# Patient Record
Sex: Male | Born: 1960 | Race: White | Hispanic: No | Marital: Married | Smoking: Never smoker
Health system: Southern US, Community
[De-identification: ages and names within clinical notes are randomized; demographics above are authoritative.]

## PROBLEM LIST (undated history)

## (undated) DIAGNOSIS — J3489 Other specified disorders of nose and nasal sinuses: Secondary | ICD-10-CM

## (undated) DIAGNOSIS — R42 Dizziness and giddiness: Secondary | ICD-10-CM

## (undated) DIAGNOSIS — M5126 Other intervertebral disc displacement, lumbar region: Secondary | ICD-10-CM

## (undated) DIAGNOSIS — Z973 Presence of spectacles and contact lenses: Secondary | ICD-10-CM

## (undated) DIAGNOSIS — M5136 Other intervertebral disc degeneration, lumbar region: Secondary | ICD-10-CM

---

## 2008-01-10 ENCOUNTER — Ambulatory Visit: Payer: Self-pay | Admitting: Family Medicine

## 2008-01-13 ENCOUNTER — Ambulatory Visit: Payer: Self-pay | Admitting: Family Medicine

## 2010-08-10 ENCOUNTER — Ambulatory Visit: Payer: Self-pay | Admitting: Gastroenterology

## 2011-11-26 ENCOUNTER — Ambulatory Visit: Payer: Self-pay | Admitting: Family Medicine

## 2014-10-19 ENCOUNTER — Ambulatory Visit: Payer: Self-pay | Admitting: Family Medicine

## 2015-05-23 ENCOUNTER — Other Ambulatory Visit: Payer: Self-pay | Admitting: Ophthalmology

## 2015-05-23 ENCOUNTER — Ambulatory Visit
Admission: RE | Admit: 2015-05-23 | Discharge: 2015-05-23 | Disposition: A | Discharge status: Home / Self Care | Payer: 59 | Source: Ambulatory Visit | Attending: Ophthalmology | Admitting: Ophthalmology

## 2015-05-23 DIAGNOSIS — D4989 Neoplasm of unspecified behavior of other specified sites: Secondary | ICD-10-CM

## 2015-05-23 DIAGNOSIS — D487 Neoplasm of uncertain behavior of other specified sites: Secondary | ICD-10-CM | POA: Diagnosis not present

## 2015-05-23 MED ORDER — GADOBENATE DIMEGLUMINE 529 MG/ML IV SOLN
20.0000 mL | Freq: Once | INTRAVENOUS | Status: AC | PRN
  Administered 2015-05-23: 16 mL via INTRAVENOUS

## 2015-05-29 HISTORY — PX: RECONSTRUCTION MANDIBLE / MAXILLA: SUR1083

## 2016-07-09 IMAGING — MR MR ORBITS WO/W CM
4 of 7 series · 19 of 48 positions shown · IV contrast (multihance)
Comparison: None.

CLINICAL DATA: LEFT globe is downwardly displaced. Symptoms began
last night.

EXAM:
MRI OF THE ORBITS WITHOUT AND WITH CONTRAST
TECHNIQUE: Multiplanar, multisequence MR imaging of the orbits was performed
both before and after the administration of intravenous contrast.
CONTRAST:  16mL MULTIHANCE GADOBENATE DIMEGLUMINE 529 MG/ML IV SOLN

[Series 2: T1 · sagittal · 5.0mm · 0.47mm/px · 3 of 27 slices shown (1 of 2)]
[im 4/27]
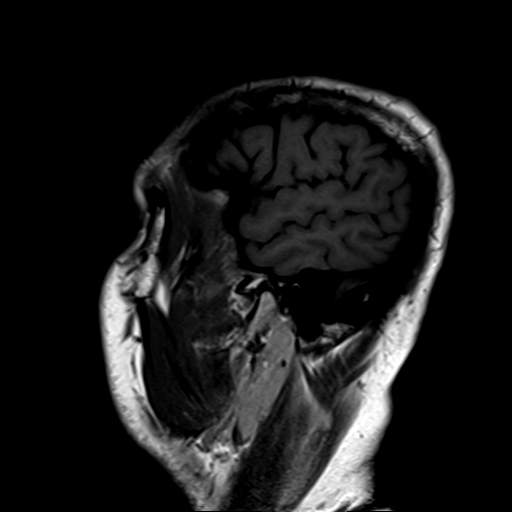
[im 15/27]
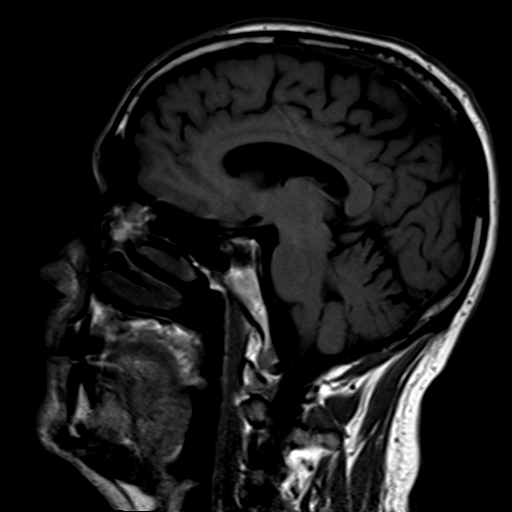
[im 23/27]
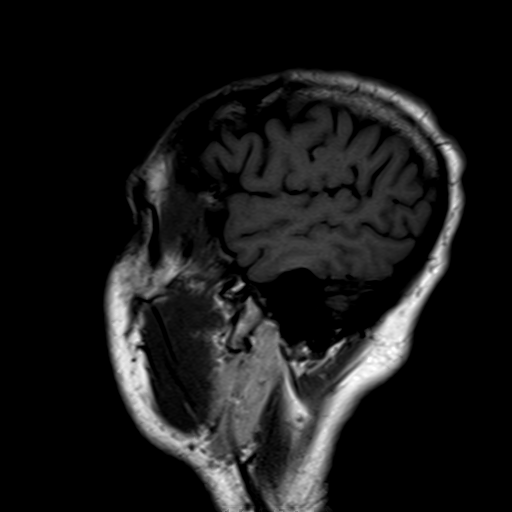

[Series 4: T2 fat-sat · axial · 3.0mm · 0.35mm/px · z∈[-39,+26]mm · 6 of 21 slices shown (1 of 2)]
[im 1/21]
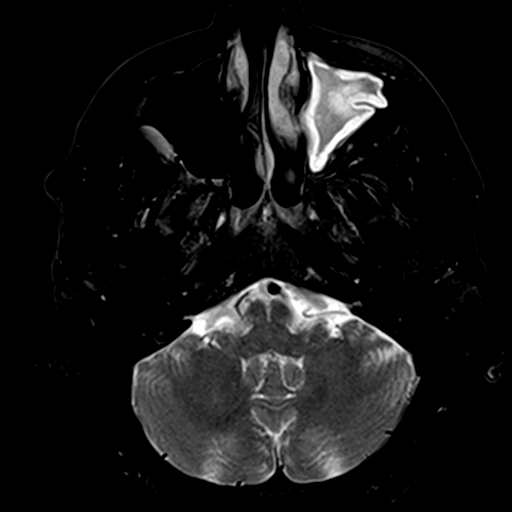
[im 5/21]
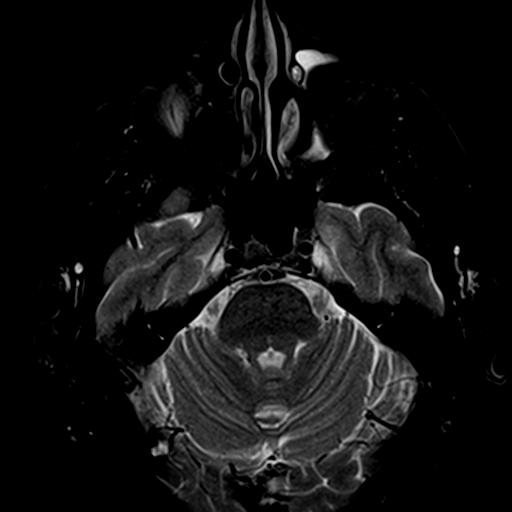
[im 9/21]
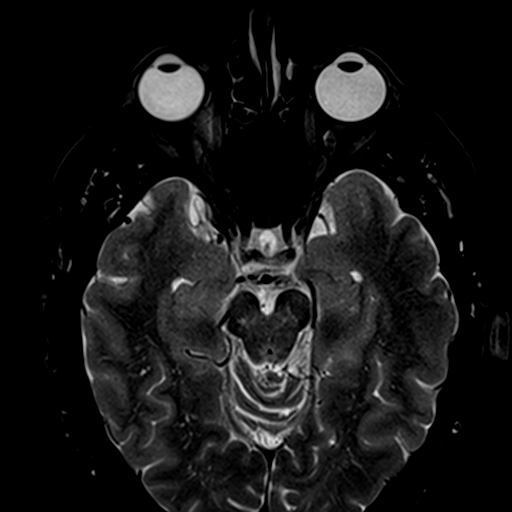
[im 13/21]
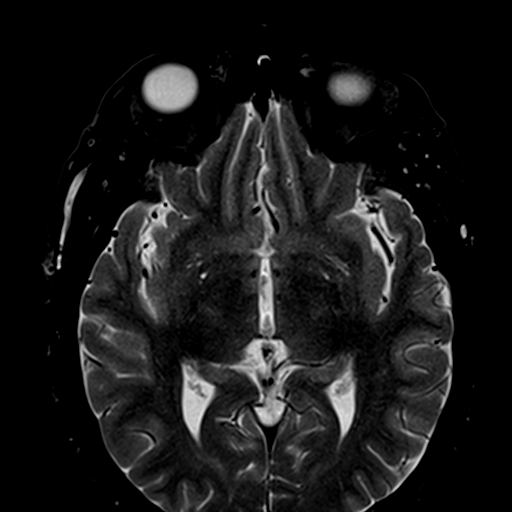
[im 17/21]
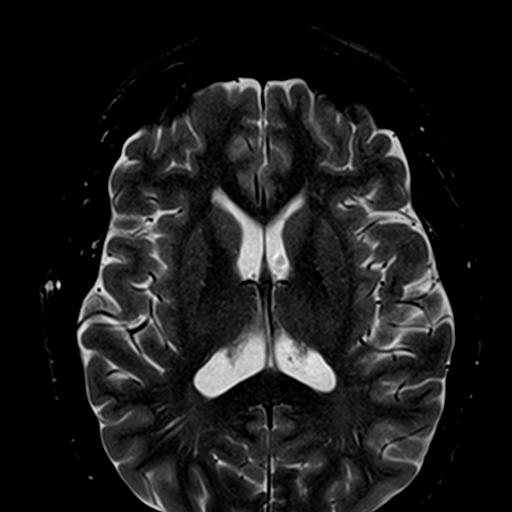
[im 21/21]
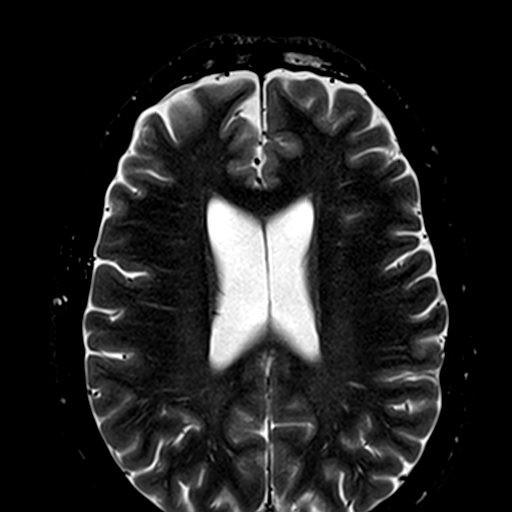

[Series 5: T2 fat-sat · coronal · 3.0mm · 0.35mm/px · 7 of 23 slices shown (2 of 2)]
[im 1/23]
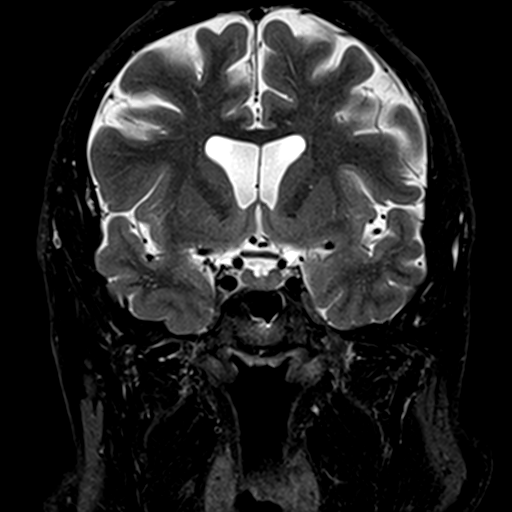
[im 4/23]
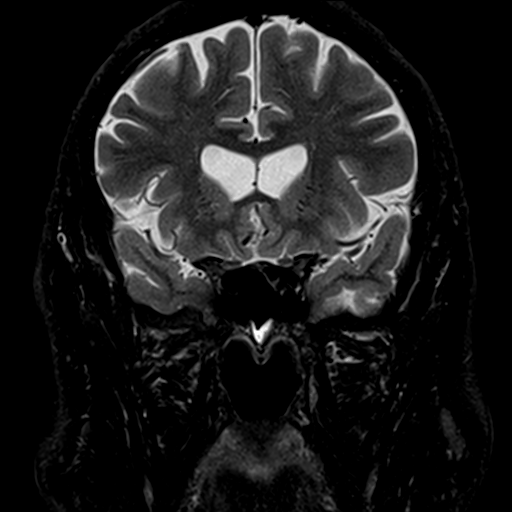
[im 8/23]
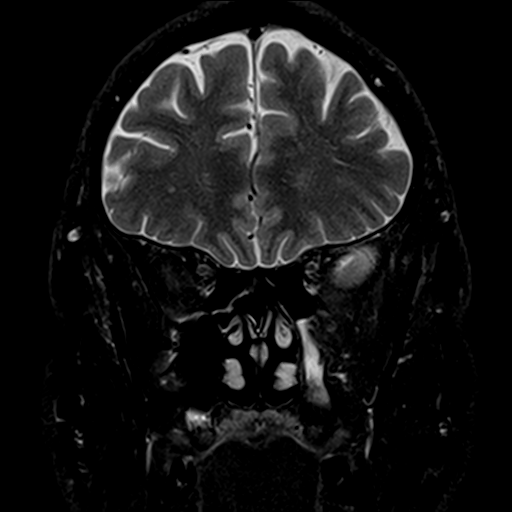
[im 12/23]
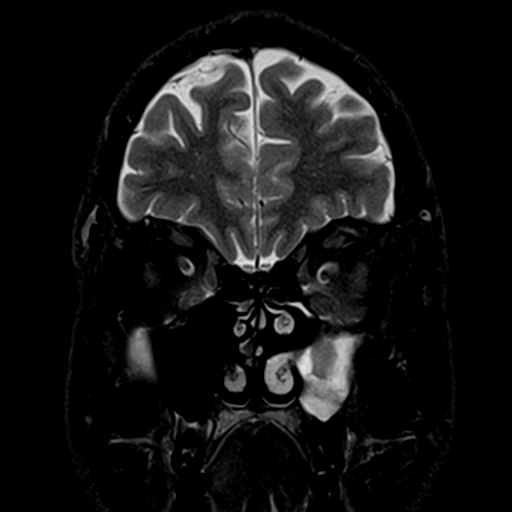
[im 15/23]
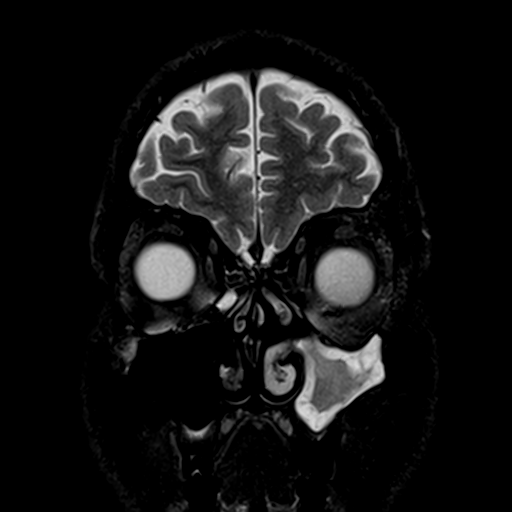
[im 19/23]
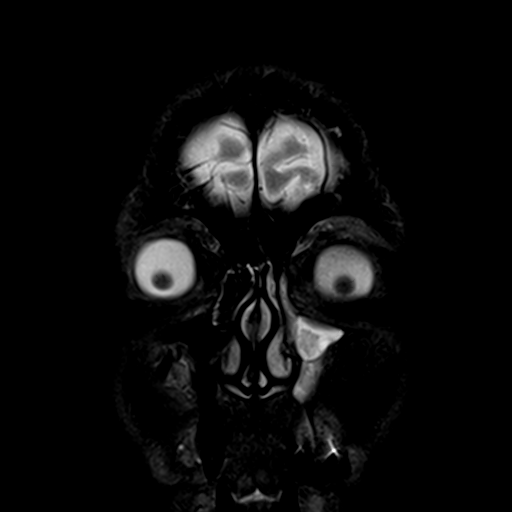
[im 23/23]
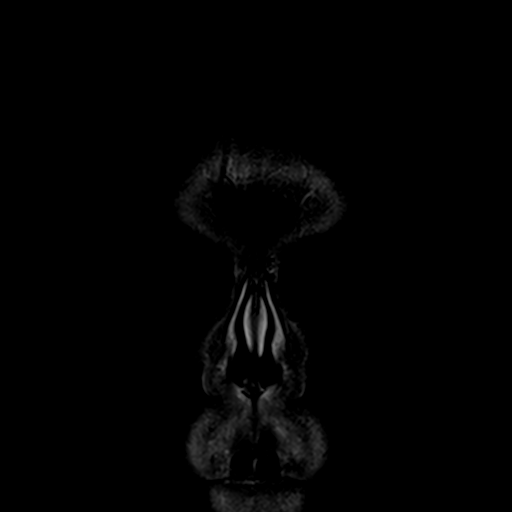

[Series 6: T1 · axial · 3.0mm · 0.39mm/px · z∈[-25,+27]mm · 3 of 21 slices shown (2 of 2)]
[im 5/21]
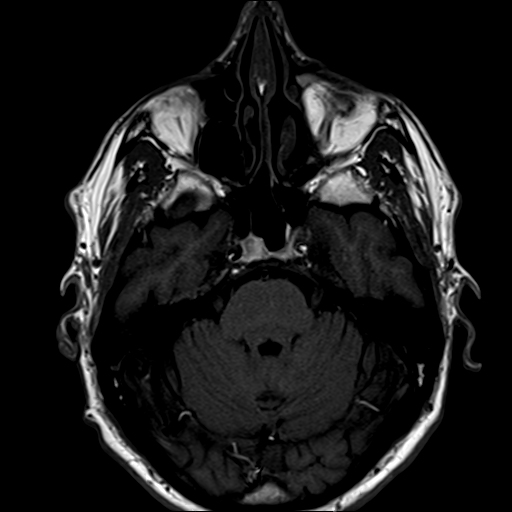
[im 13/21]
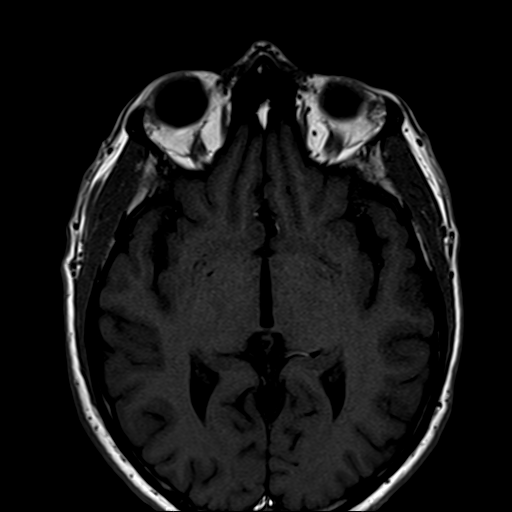
[im 21/21]
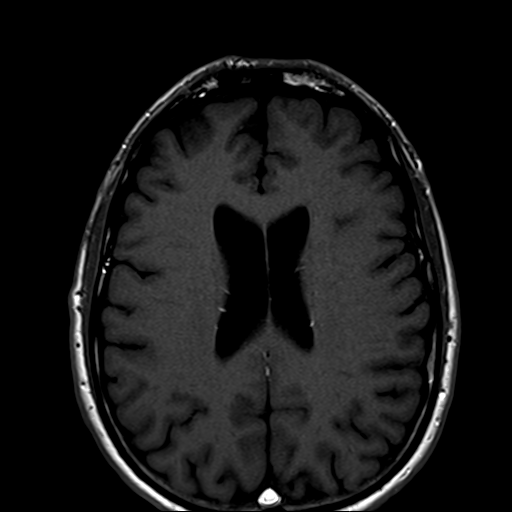

[19 of 48 positions shown; findings below may reference images not displayed]

FINDINGS: Both globes are symmetric in size and grossly normal in location and
contents.

Extra-ocular muscles are normal size and location.

Normal intraconal fat. Normal optic nerves and visualized
intracranial optic pathways.

The LEFT orbital floor bulges downward. The walls of the maxillary
sinus on the LEFT are inwardly retracted accounting for the
asymmetric LEFT orbital enlargement. The orbital floor although
bulges downward is otherwise intact. There is chronic LEFT maxillary
sinus mucosal thickening along with proteinaceous secretions
centrally. No nasal cavity masses, although the LEFT maxillary sinus
ostium is presumed stenotic or obstructed.

RIGHT orbit is normal. Minor paranasal sinus disease elsewhere.
Visualized intracranial compartment unremarkable.
IMPRESSION: Findings consistent with LEFT maxillary silent sinus syndrome.
Chronic maxillary sinus atelectasis leads to orbital enlargement,
downward bulging of the LEFT orbital floor, and potentially LEFT
enophthalmos.

Findings discussed with ordering provider.

## 2016-09-27 DIAGNOSIS — R42 Dizziness and giddiness: Secondary | ICD-10-CM

## 2016-09-27 HISTORY — DX: Dizziness and giddiness: R42

## 2017-02-24 ENCOUNTER — Other Ambulatory Visit: Payer: Self-pay | Admitting: Podiatry

## 2017-02-25 ENCOUNTER — Encounter: Payer: Self-pay | Admitting: *Deleted

## 2017-02-25 NOTE — Discharge Instructions (Signed)
[facility] °[facility] ° °POST OPERATIVE INSTRUCTIONS FOR DR. TROXLER AND DR. FOWLER °[facility] ° ° °1. Take your medication as prescribed.  Pain medication should be taken only as needed. ° °2. Keep the dressing clean, dry and intact. ° °3. Keep your foot elevated above the heart level for the first 48 hours. ° °4. Walking to the bathroom and brief periods of walking are acceptable, unless we have instructed you to be non-weight bearing. ° °5. Always wear your post-op shoe when walking.  Always use your crutches if you are to be non-weight bearing. ° °6. Do not take a shower. Baths are permissible as long as the foot is kept out of the water.  ° °7. Every hour you are awake:  °- Bend your knee 15 times. °- Flex foot 15 times °- Massage calf 15 times ° °8. Call [facility] ([phone]) if any of the following problems occur: °- You develop a temperature or fever. °- The bandage becomes saturated with blood. °- Medication does not stop your pain. °- Injury of the foot occurs. °- Any symptoms of infection including redness, odor, or red streaks running from wound. ° ° °General Anesthesia, Adult, Care After °These instructions provide you with information about caring for yourself after your procedure. Your health care provider may also give you more specific instructions. Your treatment has been planned according to current medical practices, but problems sometimes occur. Call your health care provider if you have any problems or questions after your procedure. °What can I expect after the procedure? °After the procedure, it is common to have: °· Vomiting. °· A sore throat. °· Mental slowness. °It is common to feel: °· Nauseous. °· Cold or shivery. °· Sleepy. °· Tired. °· Sore or achy, even in parts of your body where you did not have surgery. °Follow these instructions at home: °For at least 24 hours after the procedure: °· Do not: °¨ Participate in  activities where you could fall or become injured. °¨ Drive. °¨ Use heavy machinery. °¨ Drink alcohol. °¨ Take sleeping pills or medicines that cause drowsiness. °¨ Make important decisions or sign legal documents. °¨ Take care of children on your own. °· Rest. °Eating and drinking °· If you vomit, drink water, juice, or soup when you can drink without vomiting. °· Drink enough fluid to keep your urine clear or pale yellow. °· Make sure you have little or no nausea before eating solid foods. °· Follow the diet recommended by your health care provider. °General instructions °· Have a responsible adult stay with you until you are awake and alert. °· Return to your normal activities as told by your health care provider. Ask your health care provider what activities are safe for you. °· Take over-the-counter and prescription medicines only as told by your health care provider. °· If you smoke, do not smoke without supervision. °· Keep all follow-up visits as told by your health care provider. This is important. °Contact a health care provider if: °· You continue to have nausea or vomiting at home, and medicines are not helpful. °· You cannot drink fluids or start eating again. °· You cannot urinate after 8-12 hours. °· You develop a skin rash. °· You have fever. °· You have increasing redness at the site of your procedure. °Get help right away if: °· You have difficulty breathing. °· You have chest pain. °· You have unexpected bleeding. °· You feel that you are having   a life-threatening or urgent problem. °This information is not intended to replace advice given to you by your health care provider. Make sure you discuss any questions you have with your health care provider. °Document Released: 01/20/2001 Document Revised: 03/18/2016 Document Reviewed: 09/28/2015 °Elsevier Interactive Patient Education © 2017 [employer] ° °

## 2017-02-26 ENCOUNTER — Ambulatory Visit: Payer: 59 | Admitting: Anesthesiology

## 2017-02-26 ENCOUNTER — Encounter
Admission: RE | Disposition: A | Discharge status: Home / Self Care | Payer: Self-pay | Source: Ambulatory Visit | Attending: Podiatry

## 2017-02-26 ENCOUNTER — Ambulatory Visit
Admission: RE | Admit: 2017-02-26 | Discharge: 2017-02-26 | Disposition: A | Discharge status: Home / Self Care | Payer: 59 | Source: Ambulatory Visit | Attending: Podiatry | Admitting: Podiatry

## 2017-02-26 DIAGNOSIS — Z9889 Other specified postprocedural states: Secondary | ICD-10-CM | POA: Diagnosis not present

## 2017-02-26 DIAGNOSIS — R7302 Impaired glucose tolerance (oral): Secondary | ICD-10-CM | POA: Diagnosis not present

## 2017-02-26 DIAGNOSIS — Z79899 Other long term (current) drug therapy: Secondary | ICD-10-CM | POA: Insufficient documentation

## 2017-02-26 DIAGNOSIS — X58XXXA Exposure to other specified factors, initial encounter: Secondary | ICD-10-CM | POA: Diagnosis not present

## 2017-02-26 DIAGNOSIS — Z8349 Family history of other endocrine, nutritional and metabolic diseases: Secondary | ICD-10-CM | POA: Insufficient documentation

## 2017-02-26 DIAGNOSIS — E7800 Pure hypercholesterolemia, unspecified: Secondary | ICD-10-CM | POA: Insufficient documentation

## 2017-02-26 DIAGNOSIS — Z888 Allergy status to other drugs, medicaments and biological substances status: Secondary | ICD-10-CM | POA: Insufficient documentation

## 2017-02-26 DIAGNOSIS — Z820 Family history of epilepsy and other diseases of the nervous system: Secondary | ICD-10-CM | POA: Diagnosis not present

## 2017-02-26 DIAGNOSIS — S92352A Displaced fracture of fifth metatarsal bone, left foot, initial encounter for closed fracture: Secondary | ICD-10-CM | POA: Diagnosis not present

## 2017-02-26 DIAGNOSIS — Z811 Family history of alcohol abuse and dependence: Secondary | ICD-10-CM | POA: Insufficient documentation

## 2017-02-26 HISTORY — DX: Presence of spectacles and contact lenses: Z97.3

## 2017-02-26 HISTORY — PX: ORIF TOE FRACTURE: SHX5032

## 2017-02-26 HISTORY — DX: Other specified disorders of nose and nasal sinuses: J34.89

## 2017-02-26 HISTORY — DX: Other intervertebral disc displacement, lumbar region: M51.26

## 2017-02-26 HISTORY — DX: Other intervertebral disc degeneration, lumbar region: M51.36

## 2017-02-26 HISTORY — DX: Dizziness and giddiness: R42

## 2017-02-26 SURGERY — OPEN REDUCTION INTERNAL FIXATION (ORIF) METATARSAL (TOE) FRACTURE
Anesthesia: Monitor Anesthesia Care | Laterality: Left | Wound class: Clean

## 2017-02-26 MED ORDER — ACETAMINOPHEN 160 MG/5ML PO SOLN: 325.0000 mg | ORAL | Status: DC | PRN

## 2017-02-26 MED ORDER — FENTANYL CITRATE (PF) 100 MCG/2ML IJ SOLN: 25.0000 ug | INTRAMUSCULAR | Status: DC | PRN

## 2017-02-26 MED ORDER — OXYCODONE HCL 5 MG PO TABS: 5.0000 mg | ORAL_TABLET | Freq: Once | ORAL | Status: DC | PRN

## 2017-02-26 MED ORDER — HYDROCODONE-ACETAMINOPHEN 5-325 MG PO TABS: 1.0000 | ORAL_TABLET | Freq: Four times a day (QID) | ORAL | 0 refills | Status: AC | PRN

## 2017-02-26 MED ORDER — PROMETHAZINE HCL 25 MG/ML IJ SOLN: 6.2500 mg | INTRAMUSCULAR | Status: DC | PRN

## 2017-02-26 MED ORDER — HYDROCODONE-ACETAMINOPHEN 5-325 MG PO TABS: 1.0000 | ORAL_TABLET | ORAL | Status: DC | PRN

## 2017-02-26 MED ORDER — OXYCODONE HCL 5 MG/5ML PO SOLN: 5.0000 mg | Freq: Once | ORAL | Status: DC | PRN

## 2017-02-26 MED ORDER — LIDOCAINE HCL (CARDIAC) 20 MG/ML IV SOLN
INTRAVENOUS | Status: DC | PRN
  Administered 2017-02-26: 50 mg via INTRAVENOUS

## 2017-02-26 MED ORDER — CEFAZOLIN SODIUM-DEXTROSE 2-4 GM/100ML-% IV SOLN
2.0000 g | INTRAVENOUS | Status: AC
  Administered 2017-02-26: 2 g via INTRAVENOUS

## 2017-02-26 MED ORDER — LIDOCAINE-EPINEPHRINE (PF) 1 %-1:200000 IJ SOLN
INTRAMUSCULAR | Status: DC | PRN
  Administered 2017-02-26: 4 mL

## 2017-02-26 MED ORDER — BUPIVACAINE HCL (PF) 0.25 % IJ SOLN
INTRAMUSCULAR | Status: DC | PRN
  Administered 2017-02-26: 10 mL

## 2017-02-26 MED ORDER — PROPOFOL 500 MG/50ML IV EMUL
INTRAVENOUS | Status: DC | PRN
  Administered 2017-02-26: 75 ug/kg/min via INTRAVENOUS

## 2017-02-26 MED ORDER — ONDANSETRON HCL 4 MG PO TABS: 4.0000 mg | ORAL_TABLET | Freq: Four times a day (QID) | ORAL | Status: DC | PRN

## 2017-02-26 MED ORDER — MIDAZOLAM HCL 5 MG/5ML IJ SOLN
INTRAMUSCULAR | Status: DC | PRN
  Administered 2017-02-26 (×2): 2 mg via INTRAVENOUS

## 2017-02-26 MED ORDER — ACETAMINOPHEN 325 MG PO TABS: 325.0000 mg | ORAL_TABLET | ORAL | Status: DC | PRN

## 2017-02-26 MED ORDER — ONDANSETRON HCL 4 MG/2ML IJ SOLN: 4.0000 mg | Freq: Four times a day (QID) | INTRAMUSCULAR | Status: DC | PRN

## 2017-02-26 MED ORDER — ROPIVACAINE HCL 5 MG/ML IJ SOLN
INTRAMUSCULAR | Status: DC | PRN
  Administered 2017-02-26: 30 mL via PERINEURAL

## 2017-02-26 MED ORDER — FENTANYL CITRATE (PF) 100 MCG/2ML IJ SOLN
INTRAMUSCULAR | Status: DC | PRN
  Administered 2017-02-26 (×2): 50 ug via INTRAVENOUS

## 2017-02-26 MED ORDER — LACTATED RINGERS IV SOLN
10.0000 mL/h | INTRAVENOUS | Status: DC
  Administered 2017-02-26: 10 mL/h via INTRAVENOUS

## 2017-02-26 MED ORDER — POVIDONE-IODINE 7.5 % EX SOLN
Freq: Once | CUTANEOUS | Status: AC
  Administered 2017-02-26: 11:00:00 via TOPICAL

## 2017-02-26 SURGICAL SUPPLY — 38 items
BANDAGE ELASTIC 4 LF NS (GAUZE/BANDAGES/DRESSINGS) ×2 IMPLANT
BNDG COHESIVE 4X5 TAN STRL (GAUZE/BANDAGES/DRESSINGS) ×2 IMPLANT
BNDG ESMARK 4X12 TAN STRL LF (GAUZE/BANDAGES/DRESSINGS) ×2 IMPLANT
BNDG GAUZE 4.5X4.1 6PLY STRL (MISCELLANEOUS) ×2 IMPLANT
BNDG STRETCH 4X75 STRL LF (GAUZE/BANDAGES/DRESSINGS) ×2 IMPLANT
CANISTER SUCT 1200ML W/VALVE (MISCELLANEOUS) ×2 IMPLANT
DRAPE FLUOR MINI C-ARM 54X84 (DRAPES) ×2 IMPLANT
DURAPREP 26ML APPLICATOR (WOUND CARE) ×2 IMPLANT
GAUZE PETRO XEROFOAM 1X8 (MISCELLANEOUS) ×2 IMPLANT
GAUZE SPONGE 4X4 12PLY STRL (GAUZE/BANDAGES/DRESSINGS) ×2 IMPLANT
GLOVE BIO SURGEON STRL SZ7.5 (GLOVE) ×4 IMPLANT
GLOVE INDICATOR 8.0 STRL GRN (GLOVE) ×4 IMPLANT
GOWN STRL REUS W/ TWL LRG LVL3 (GOWN DISPOSABLE) ×2 IMPLANT
GOWN STRL REUS W/TWL LRG LVL3 (GOWN DISPOSABLE) ×2
KIRSCHNER WIRE ×2 IMPLANT
KIT ROOM TURNOVER OR (KITS) ×2 IMPLANT
NEEDLE FILTER BLUNT 18X 1/2SAF (NEEDLE) ×1
NEEDLE FILTER BLUNT 18X1 1/2 (NEEDLE) ×1 IMPLANT
NEEDLE HYPO 25GX1X1/2 BEV (NEEDLE) ×2 IMPLANT
NS IRRIG 500ML POUR BTL (IV SOLUTION) ×2 IMPLANT
PACK EXTREMITY ARMC (MISCELLANEOUS) ×2 IMPLANT
PAD GROUND ADULT SPLIT (MISCELLANEOUS) ×2 IMPLANT
PLATE LC DCP 2.0 6H (Plate) ×2 IMPLANT
SCREW CORTEX ST 2.0X10 (Screw) IMPLANT
SCREW CORTEX ST 2.0X12 (Screw) ×8 IMPLANT
SCREW CORTEX ST 2.0X14 (Screw) ×2 IMPLANT
STOCKINETTE IMPERVIOUS LG (DRAPES) ×2 IMPLANT
STRAP BODY AND KNEE 60X3 (MISCELLANEOUS) ×2 IMPLANT
STRIP CLOSURE SKIN 1/4X4 (GAUZE/BANDAGES/DRESSINGS) IMPLANT
SUT ETHILON 4-0 (SUTURE)
SUT ETHILON 4-0 FS2 18XMFL BLK (SUTURE)
SUT ETHILON 5-0 FS-2 18 BLK (SUTURE) IMPLANT
SUT MNCRL+ 5-0 UNDYED PC-3 (SUTURE) ×1 IMPLANT
SUT MONOCRYL 5-0 (SUTURE) ×1
SUT VIC AB 4-0 FS2 27 (SUTURE) ×2 IMPLANT
SUT VICRYL AB 3-0 FS1 BRD 27IN (SUTURE) IMPLANT
SUTURE ETHLN 4-0 FS2 18XMF BLK (SUTURE) IMPLANT
SYRINGE 10CC LL (SYRINGE) ×2 IMPLANT

## 2017-02-26 NOTE — Anesthesia Procedure Notes (Signed)
Anesthesia Regional Block: Popliteal block   Pre-Anesthetic Checklist: ,, timeout performed, Correct Patient, Correct Site, Correct Laterality, Correct Procedure, Correct Position, site marked, Risks and benefits discussed,  Surgical consent,  Pre-op evaluation,  At surgeon's request and post-op pain management  Laterality: Left  Prep: chloraprep       Needles:  Injection technique: Single-shot     Needle Length: 10cm  Needle Gauge: 21     Additional Needles:   Procedures: ultrasound guided,,,,,,,,  Narrative:  Start time: 02/26/2017 11:05 AM End time: 02/26/2017 11:13 AM Injection made incrementally with aspirations every 5 mL.  Performed by: Personally  Anesthesiologist: Veda Canning  Additional Notes: 63mL 0.5% ropivacaine total. No immediate complications. Patient tolerated well. Veda Canning, MD

## 2017-02-26 NOTE — Transfer of Care (Signed)
Immediate Anesthesia Transfer of Care Note  Patient: Albert Aguilar  Procedure(s) Performed: Procedure(s) with comments: OPEN REDUCTION INTERNAL FIXATION (ORIF) METATARSAL (TOE) FRACTURE-5th (Left) - GENERAL WITH POPLITEAL  Patient Location: PACU  Anesthesia Type: Regional, MAC  Level of Consciousness: awake, alert  and patient cooperative  Airway and Oxygen Therapy: Patient Spontanous Breathing and Patient connected to supplemental oxygen  Post-op Assessment: Post-op Vital signs reviewed, Patient's Cardiovascular Status Stable, Respiratory Function Stable, Patent Airway and No signs of Nausea or vomiting  Post-op Vital Signs: Reviewed and stable  Complications: No apparent anesthesia complications

## 2017-02-26 NOTE — Anesthesia Preprocedure Evaluation (Addendum)
Anesthesia Evaluation  Patient identified by MRN, date of birth, ID band Patient awake    Reviewed: Allergy & Precautions, NPO status   History of Anesthesia Complications Negative for: history of anesthetic complications  Airway Mallampati: II  TM Distance: >3 FB Neck ROM: full    Dental  (+) Teeth Intact   Pulmonary neg pulmonary ROS,    breath sounds clear to auscultation       Cardiovascular negative cardio ROS   Rhythm:regular Rate:Normal     Neuro/Psych    GI/Hepatic negative GI ROS,   Endo/Other    Renal/GU      Musculoskeletal 5th metatarsal fracture   Bulging lumbar disk   Abdominal   Peds  Hematology   Anesthesia Other Findings   Reproductive/Obstetrics                             Anesthesia Physical Anesthesia Plan  ASA: I  Anesthesia Plan: Regional and General LMA   Post-op Pain Management:  Regional for Post-op pain   Induction:   Airway Management Planned:   Additional Equipment:   Intra-op Plan:   Post-operative Plan:   Informed Consent: I have reviewed the patients History and Physical, chart, labs and discussed the procedure including the risks, benefits and alternatives for the proposed anesthesia with the patient or authorized representative who has indicated his/her understanding and acceptance.     Plan Discussed with: CRNA  Anesthesia Plan Comments:        Anesthesia Quick Evaluation

## 2017-02-26 NOTE — H&P (Signed)
HISTORY AND PHYSICAL INTERVAL NOTE:  02/26/2017  12:18 PM  Albert Aguilar  has presented today for surgery, with the diagnosis of Closed displace fracutre of fifth metatarsal bone left foot  S92.352.A.  The various methods of treatment have been discussed with the patient.  No guarantees were given.  After consideration of risks, benefits and other options for treatment, the patient has consented to surgery.  I have reviewed the patients' chart and labs.    Patient Vitals for the past 24 hrs:  BP Temp Temp src Pulse Resp SpO2 Height Weight  02/26/17 1130 102/77 - - 71 13 99 % - -  02/26/17 1125 102/78 - - 72 15 99 % - -  02/26/17 1120 104/75 - - 68 12 99 % - -  02/26/17 1115 100/77 - - 73 15 100 % - -  02/26/17 1110 106/76 - - 74 12 100 % - -  02/26/17 1105 123/81 - - 73 15 100 % - -  02/26/17 1050 137/77 97.5 F (36.4 C) Temporal 78 16 100 % 6' (1.829 m) 83.5 kg (184 lb)    A history and physical examination was performed in my office.  The patient was reexamined.  There have been no changes to this history and physical examination.  Samara Deist A

## 2017-02-26 NOTE — Anesthesia Procedure Notes (Signed)
Procedure Name: MAC Performed by: Mayme Genta Pre-anesthesia Checklist: Patient identified, Emergency Drugs available, Suction available, Patient being monitored and Timeout performed Patient Re-evaluated:Patient Re-evaluated prior to inductionOxygen Delivery Method: Simple face mask Placement Confirmation: positive ETCO2 and breath sounds checked- equal and bilateral

## 2017-02-26 NOTE — Op Note (Addendum)
Operative note   Surgeon:Daryle Boyington Lawyer: None    Preop diagnosis: Left fifth metatarsal fracture    Postop diagnosis: Same    Procedure: Open reduction with internal fixation left fifth metatarsal fracture    EBL: Minimal    Anesthesia:regional and IV sedation    Hemostasis: Epinephrine infiltrated along the incision site    Specimen: None    Complications: None    Operative indications:Reagen Husein Guedes is an 56 y.o. that presents today for surgical intervention.  The risks/benefits/alternatives/complications have been discussed and consent has been given.    Procedure:  Patient was brought into the OR and placed on the operating table in thesupine position. After anesthesia was obtained theleft lower extremity was prepped and draped in usual sterile fashion.  Attention was directed to the dorsal lateral aspect of the left fifth MTPJ and metatarsal where a longitudinal incision was made overlying the fracture site. This was on the distal one half of the metatarsal. Sharp and blunt dissection carried down to the periosteum. Subperiosteal dissection was then performed. There was noted comminuted fifth metatarsal fracture. A large butterfly fragment was noted in the medial space. Comminution plantar and distally. A large bone second was noted distally of the metatarsal head. The lateral aspect of the fifth metatarsal was intact. With bone reduction forceps I was able to reduce the fracture fragment and butterfly fracture. Alignment was taken out to length without acute reduction. A 2.0 mm LCD plate from the [employer] modular foot set was placed laterally with 2 holes distal and 3 holes proximal to the fracture site filled with 2.0 mm screws. Good alignment was noted in multiple planes. There was noted to be comminution to the plantar lateral aspect of the fifth metatarsal head. Stability was noted. The dorsal portion of the metatarsal was noted to be quite stable at this time.  The wound was flushed with copious amounts or irrigation. Layered closure was performed with a 4-0 Vicryl for the deeper and subcutaneous tissues and a 5-0 Monocryl for the skin. 0.25% Marcaine was then cut along incision site.    Patient tolerated the procedure and anesthesia well.  Was transported from the OR to the PACU with all vital signs stable and vascular status intact. To be discharged per routine protocol.  Will follow up in approximately 1 week in the outpatient clinic.

## 2017-02-26 NOTE — Anesthesia Postprocedure Evaluation (Signed)
Anesthesia Post Note  Patient: Albert Aguilar  Procedure(s) Performed: Procedure(s) (LRB): OPEN REDUCTION INTERNAL FIXATION (ORIF) METATARSAL (TOE) FRACTURE-5th (Left)  Patient location during evaluation: PACU Anesthesia Type: Regional Level of consciousness: awake and alert Pain management: pain level controlled Vital Signs Assessment: post-procedure vital signs reviewed and stable Respiratory status: spontaneous breathing, nonlabored ventilation and respiratory function stable Cardiovascular status: stable Postop Assessment: no signs of nausea or vomiting Anesthetic complications: no    Veda Canning

## 2017-02-26 NOTE — Progress Notes (Signed)
Assisted Dr. Veda Canning with left, ultrasound guided, popliteal block. Side rails up, monitors on throughout procedure. See vital signs in flow sheet. Tolerated Procedure well.

## 2017-02-28 ENCOUNTER — Encounter: Payer: Self-pay | Admitting: Podiatry
